# Patient Record
Sex: Male | Born: 1980 | Race: White | Hispanic: No | Marital: Single | Smoking: Never smoker
Health system: Southern US, Community
[De-identification: ages and names within clinical notes are randomized; demographics above are authoritative.]

## PROBLEM LIST (undated history)

## (undated) DIAGNOSIS — N200 Calculus of kidney: Secondary | ICD-10-CM

---

## 2001-11-29 ENCOUNTER — Ambulatory Visit (HOSPITAL_BASED_OUTPATIENT_CLINIC_OR_DEPARTMENT_OTHER): Admission: RE | Admit: 2001-11-29 | Discharge: 2001-11-29 | Payer: Self-pay | Admitting: *Deleted

## 2003-08-15 ENCOUNTER — Emergency Department (HOSPITAL_COMMUNITY): Admission: EM | Admit: 2003-08-15 | Discharge: 2003-08-16 | Payer: Self-pay | Admitting: Emergency Medicine

## 2008-07-29 ENCOUNTER — Emergency Department (HOSPITAL_COMMUNITY): Admission: EM | Admit: 2008-07-29 | Discharge: 2008-07-30 | Payer: Self-pay | Admitting: Emergency Medicine

## 2011-05-14 NOTE — Op Note (Signed)
[place]. [facility]  Patient:    ROSHON, DUELL Visit Number: [id number] MRN: [id number]          Service Type: DSU Location: [facility] Attending Physician:  Kendell Bane Dictated by:   Lowell Bouton, M.D. Proc. Date: 11/30/01 Admit Date:  11/29/2001 Discharge Date: 11/29/2001                             Operative Report  PREOPERATIVE DIAGNOSIS:  Lacerated ulnar digital nerve, left small finger.  POSTOPERATIVE DIAGNOSIS:  Lacerated ulnar digital nerve, left small finger with partial flexor digitorum superficialis tendon laceration.  PROCEDURE:  Repair of ulnar digital nerve and A2 pulley, left small finger.  SURGEON:  Lowell Bouton, M.D.  ANESTHESIA:  General anesthesia.  FINDINGS:  The patient had a complete laceration of the ulnar digital nerve at the level of the MP joint.  The flexor digitorum superficialis tendon had approximately a 40% laceration.  The profundus tendon was intact.  The A2 pulley was lacerated and was repaired to avoid hanging up from the tendon injury.  DESCRIPTION OF PROCEDURE:  Under general anesthesia with a tourniquet on the left arm, the left hand was prepped and draped in the usual fashion.  After exsanguinating the limb, the tourniquet was inflated to 225 mmHg.  The previous sutures were removed and the wound was bluntly spread open.  The retractor was inserted and the flexor tendon was identified.  The superficialis tendon had approximately a 40% laceration.  The profundus tendon was intact.  There was some triggering of the tendon.  A 6-0 Prolene suture was used to repair the pulley to avoid triggering.  The tendon did not require suturing.  The microscope was then brought in.  The ends of the digital nerve were identified.  They were trimmed back to good nerve tissue and dissected out and an end-to-end repair was performed with an interrupted 9-0 nylon suture using the microscope.   After an epineural repair was performed, the wound was irrigated copiously with saline and the skin was closed with 4-0 nylon sutures.  Sterile dressings were applied followed by dorsal protective splint.  The tourniquet was released with good circulation for the hand. The patient went to the recovery room awake, stable, and in good condition. Dictated by:   Lowell Bouton, M.D. Attending Physician:  Kendell Bane DD:  11/30/01 TD:  12/01/01 Job: [id number]

## 2011-09-24 LAB — HEPATIC FUNCTION PANEL
Albumin: 4.5
Bilirubin, Direct: <0.1
Total Bilirubin: 0.9

## 2011-09-24 LAB — PROTIME-INR: Prothrombin Time: 12.7

## 2011-09-24 LAB — CBC
MCHC: 33.8
MCV: 90.7
RBC: 5.24

## 2011-09-24 LAB — POCT I-STAT, CHEM 8
Calcium, Ion: 1.22
Glucose, Bld: 114 — ABNORMAL HIGH
HCT: 49
Hemoglobin: 16.7
Potassium: 3.5
TCO2: 28

## 2011-09-24 LAB — URINALYSIS, ROUTINE W REFLEX MICROSCOPIC
Protein, ur: 30 — AB
Urobilinogen, UA: 0.2

## 2011-09-24 LAB — DIFFERENTIAL
Basophils Relative: 0
Eosinophils Absolute: 0.1
Monocytes Relative: 4
Neutrophils Relative %: 84 — ABNORMAL HIGH

## 2011-09-24 LAB — URINE MICROSCOPIC-ADD ON

## 2011-09-24 LAB — LIPASE, BLOOD: Lipase: 30

## 2016-04-27 ENCOUNTER — Emergency Department (HOSPITAL_COMMUNITY): Payer: 59

## 2016-04-27 ENCOUNTER — Emergency Department (HOSPITAL_COMMUNITY)
Admission: EM | Admit: 2016-04-27 | Discharge: 2016-04-27 | Disposition: A | Discharge status: Home / Self Care | Payer: 59 | Attending: Emergency Medicine | Admitting: Emergency Medicine

## 2016-04-27 ENCOUNTER — Encounter (HOSPITAL_COMMUNITY): Payer: Self-pay

## 2016-04-27 DIAGNOSIS — R109 Unspecified abdominal pain: Secondary | ICD-10-CM | POA: Diagnosis present

## 2016-04-27 DIAGNOSIS — N201 Calculus of ureter: Secondary | ICD-10-CM

## 2016-04-27 HISTORY — DX: Calculus of kidney: N20.0

## 2016-04-27 LAB — URINALYSIS, ROUTINE W REFLEX MICROSCOPIC
Glucose, UA: NEGATIVE mg/dL
Ketones, ur: 15 mg/dL — AB
Nitrite: POSITIVE — AB
Protein, ur: 100 mg/dL — AB
Specific Gravity, Urine: 1.025 (ref 1.005–1.030)
pH: 7.5 (ref 5.0–8.0)

## 2016-04-27 LAB — I-STAT CHEM 8, ED
BUN: 15 mg/dL (ref 6–20)
Calcium, Ion: 1.15 mmol/L (ref 1.12–1.23)
Chloride: 106 mmol/L (ref 101–111)
Creatinine, Ser: 1.1 mg/dL (ref 0.61–1.24)
Glucose, Bld: 116 mg/dL — ABNORMAL HIGH (ref 65–99)
HCT: 48 % (ref 39.0–52.0)
Hemoglobin: 16.3 g/dL (ref 13.0–17.0)
Potassium: 3.9 mmol/L (ref 3.5–5.1)
Sodium: 143 mmol/L (ref 135–145)
TCO2: 22 mmol/L (ref 0–100)

## 2016-04-27 LAB — URINE MICROSCOPIC-ADD ON

## 2016-04-27 MED ORDER — ONDANSETRON HCL 4 MG/2ML IJ SOLN
4.0000 mg | Freq: Once | INTRAMUSCULAR | Status: AC
Start: 2016-04-27 — End: 2016-04-27
  Administered 2016-04-27: 4 mg via INTRAVENOUS
  Filled 2016-04-27: qty 2

## 2016-04-27 MED ORDER — KETOROLAC TROMETHAMINE 30 MG/ML IJ SOLN
30.0000 mg | Freq: Once | INTRAMUSCULAR | Status: AC
  Administered 2016-04-27: 30 mg via INTRAVENOUS
  Filled 2016-04-27: qty 1

## 2016-04-27 MED ORDER — HYDROMORPHONE HCL 1 MG/ML IJ SOLN: 1.0000 mg | Freq: Once | INTRAMUSCULAR | Status: DC

## 2016-04-27 MED ORDER — OXYCODONE-ACETAMINOPHEN 5-325 MG PO TABS: 1.0000 | ORAL_TABLET | Freq: Four times a day (QID) | ORAL | Status: AC | PRN

## 2016-04-27 MED ORDER — HYDROMORPHONE HCL 1 MG/ML IJ SOLN
1.0000 mg | Freq: Once | INTRAMUSCULAR | Status: AC
  Administered 2016-04-27: 1 mg via INTRAVENOUS
  Filled 2016-04-27: qty 1

## 2016-04-27 MED ORDER — TAMSULOSIN HCL 0.4 MG PO CAPS: 0.4000 mg | ORAL_CAPSULE | Freq: Every day | ORAL | Status: AC

## 2016-04-27 NOTE — Discharge Instructions (Signed)
Return here as needed for any worsening in your condition.  Follow-up with the urologist provided.  Increase your fluid intake

## 2016-04-27 NOTE — ED Provider Notes (Signed)
CSN: [id number]     Arrival date & time 04/27/16  0846 History   First MD Initiated Contact with Patient 04/27/16 [phone]     Chief Complaint  Patient presents with  . Flank Pain     (Consider location/radiation/quality/duration/timing/severity/associated sxs/prior Treatment) HPI Patient presents to the emergency department with a sudden onset of left flank pain that started this morning.  The patient states that he started with sudden pain with nausea but no vomiting.  Patient states that nothing seems make the condition better or worse.  He states he did not take any medications prior to arrival.The patient denies chest pain, shortness of breath, headache,blurred vision, neck pain, fever, cough, weakness, numbness, dizziness, anorexia, edema, vomiting, diarrhea, rash, back pain, dysuria, hematemesis, bloody stool, near syncope, or syncope. Past Medical History  Diagnosis Date  . Kidney stone    History reviewed. No pertinent past surgical history. No family history on file. Social History  Substance Use Topics  . Smoking status: Never Smoker   . Smokeless tobacco: None  . Alcohol Use: None    Review of Systems All other systems negative except as documented in the HPI. All pertinent positives and negatives as reviewed in the HPI.   Allergies  Review of patient's allergies indicates no known allergies.  Home Medications   Prior to Admission medications   Medication Sig Start Date End Date Taking? Authorizing Provider  aspirin-acetaminophen-caffeine (EXCEDRIN MIGRAINE) [id number] MG tablet Take 2 tablets by mouth every 6 (six) hours as needed for headache.   Yes Historical Provider, MD   BP 131/83 mmHg  Pulse 57  Temp(Src) 98 F (36.7 C) (Oral)  Resp 20  SpO2 96% Physical Exam  Constitutional: He is oriented to person, place, and time. He appears well-developed and well-nourished. No distress.  HENT:  Head: Normocephalic and atraumatic.  Mouth/Throat: Oropharynx is clear  and moist.  Eyes: Pupils are equal, round, and reactive to light.  Neck: Normal range of motion. Neck supple.  Cardiovascular: Normal rate, regular rhythm and normal heart sounds.  Exam reveals no gallop and no friction rub.   No murmur heard. Pulmonary/Chest: Effort normal and breath sounds normal. No respiratory distress. He has no wheezes.  Abdominal: Soft. Bowel sounds are normal. He exhibits no distension. There is no tenderness.  Neurological: He is alert and oriented to person, place, and time. He exhibits normal muscle tone. Coordination normal.  Skin: Skin is warm and dry. No rash noted. No erythema.  Psychiatric: He has a normal mood and affect. His behavior is normal.  Nursing note and vitals reviewed.   ED Course  Procedures (including critical care time) Labs Review Labs Reviewed  URINALYSIS, ROUTINE W REFLEX MICROSCOPIC (NOT AT [facility]) - Abnormal; Notable for the following:    Color, Urine RED (*)    APPearance TURBID (*)    Hgb urine dipstick LARGE (*)    Bilirubin Urine MODERATE (*)    Ketones, ur 15 (*)    Protein, ur 100 (*)    Nitrite POSITIVE (*)    Leukocytes, UA MODERATE (*)    All other components within normal limits  URINE MICROSCOPIC-ADD ON - Abnormal; Notable for the following:    Squamous Epithelial / LPF 0-5 (*)    Bacteria, UA MANY (*)    Crystals CA OXALATE CRYSTALS (*)    All other components within normal limits  I-STAT CHEM 8, ED - Abnormal; Notable for the following:    Glucose, Bld 116 (*)  All other components within normal limits    Imaging Review Ct Renal Stone Study  04/27/2016  CLINICAL DATA:  Left flank pain, nausea EXAM: CT ABDOMEN AND PELVIS WITHOUT CONTRAST TECHNIQUE: Multidetector CT imaging of the abdomen and pelvis was performed following the standard protocol without IV contrast. COMPARISON:  07/29/2008 FINDINGS: Lower chest:  Lung bases are unremarkable. Hepatobiliary: Unenhanced liver is unremarkable. No calcified gallstones are  noted within gallbladder. No intrahepatic biliary ductal dilatation. Pancreas: Unenhanced pancreas is unremarkable. Spleen: Unenhanced spleen is unremarkable. Adrenals/Urinary Tract: No adrenal gland mass. There is mild left hydronephrosis and minimal left proximal hydroureter. Nonobstructive calcified calculus in lower pole of the right kidney measures 5.7 mm. At least stool nonobstructive calculi are noted in lower pole of the left kidney the largest measures 3 mm. Axial image 46 there is 4 mm partially obstructive calculus in proximal left ureter at the level of upper endplate of L4 vertebral body. Bilateral distal ureter is unremarkable. No calcified calculi are noted within under distended urinary bladder. Stomach/Bowel: No small bowel obstruction. No gastric outlet obstruction. No thickened or dilated small bowel loops. The terminal ileum is unremarkable. Normal appendix is noted in axial image 55. No pericecal inflammation. No distal colonic obstruction.  No colitis or diverticulitis. Vascular/Lymphatic: There is no aortic aneurysm. No retroperitoneal or mesenteric adenopathy. Reproductive: The prostate gland is normal size. A central calcification within prostate gland measures 6 mm. Seminal vesicles are unremarkable. Other: No ascites or free air. Tiny umbilical hernia containing fat without evidence of acute complication. Tiny left inguinal canal hernia containing fat without evidence of acute complication. Musculoskeletal: No destructive bony lesions are noted. Sagittal images of the spine are unremarkable. No destructive bony lesions are noted within pelvis. IMPRESSION: 1. There is bilateral nonobstructive nephrolithiasis. 2. Mild left hydronephrosis and minimal proximal left hydroureter. There is 4 mm partially obstructive calculus in proximal left ureter at the level of upper endplate of L4 vertebral body. 3. Bilateral distal ureter is unremarkable. 4. Normal appendix.  No pericecal inflammation. 5. No  calcified calculi are noted within urinary bladder. 6. No small bowel obstruction. Electronically Signed   By: Natasha Mead M.D.   On: 04/27/2016 10:06   I have personally reviewed and evaluated these images and lab results as part of my medical decision-making.  Patient will be treated for a ureteral stone and advised him that he will need follow-up with urology.  The patient's pain is considerably better at this point he is feeling relief of his symptoms.  I advised the patient to return here as needed.  Patient agrees the plan and all questions were answered.  Charlestine Night, PA-C 04/27/16 1215  Benjiman Core, MD 04/27/16 1536

## 2016-04-27 NOTE — ED Notes (Signed)
Patient here with left flank pain since am. Reports that the pain was sudden with nausea and vomiting. Thinks its a stone. Pale on arrival

## 2016-04-27 NOTE — ED Notes (Signed)
Patient transported to CT 

## 2017-01-23 IMAGING — CT CT RENAL STONE PROTOCOL
2 of 4 series · 15 of 46 positions shown, 17 images · non-contrast
Comparison: 07/29/2008

CLINICAL DATA: Left flank pain, nausea

EXAM:
CT ABDOMEN AND PELVIS WITHOUT CONTRAST
TECHNIQUE: Multidetector CT imaging of the abdomen and pelvis was performed
following the standard protocol without IV contrast.

[Series 2: renal stone 5mm · axial · 0.79mm/px · z∈[-1115,-690]mm · 12 of 93 slices shown, 14 images]
[im 4/93  soft-tissue]
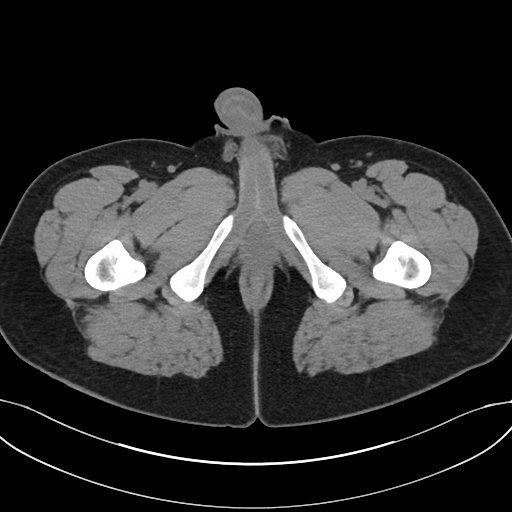
[im 4/93  bone]
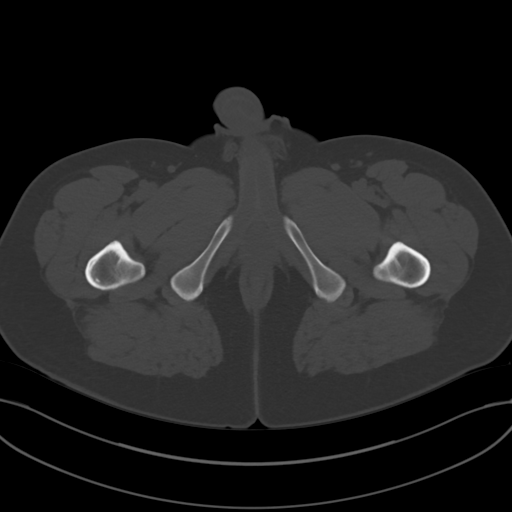
[im 12/93  soft-tissue]
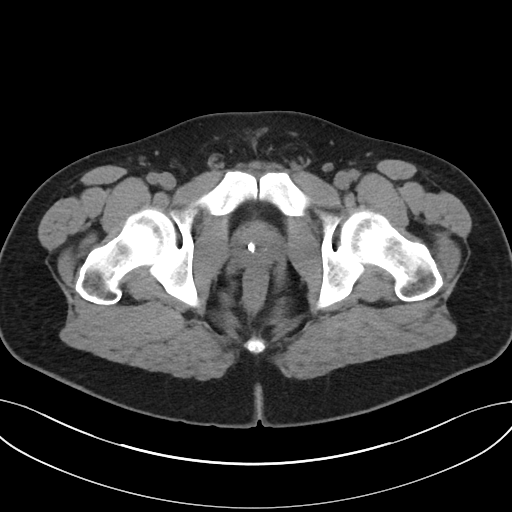
[im 20/93  soft-tissue]
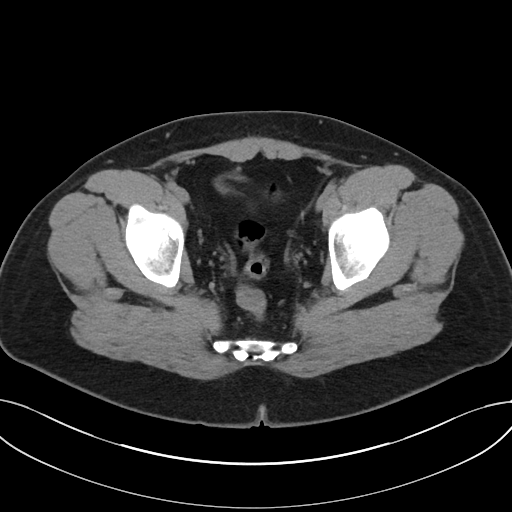
[im 27/93  soft-tissue]
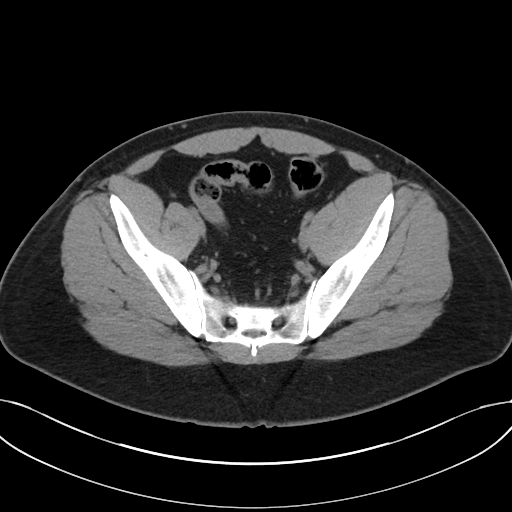
[im 35/93  soft-tissue]
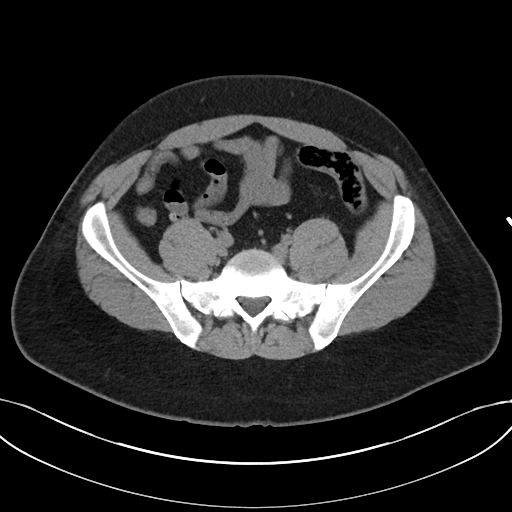
[im 43/93  soft-tissue]
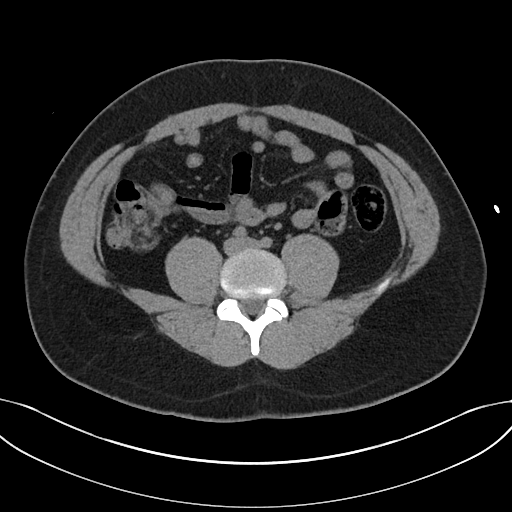
[im 50/93  soft-tissue]
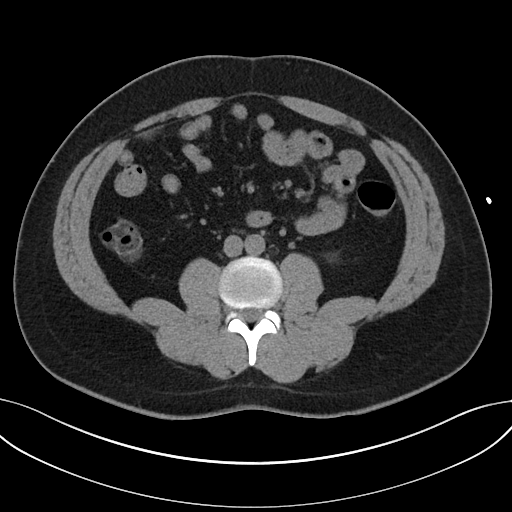
[im 58/93  soft-tissue]
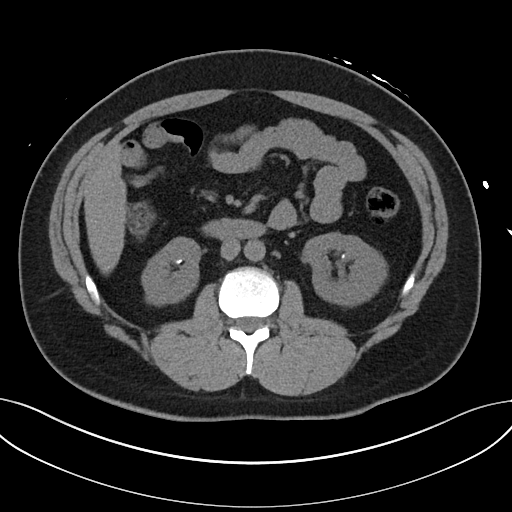
[im 66/93  soft-tissue]
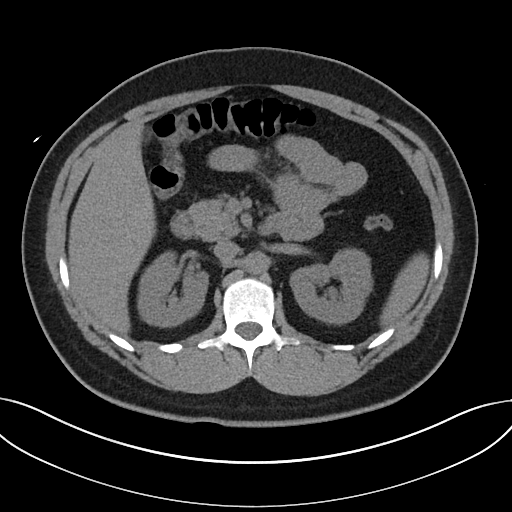
[im 66/93  bone]
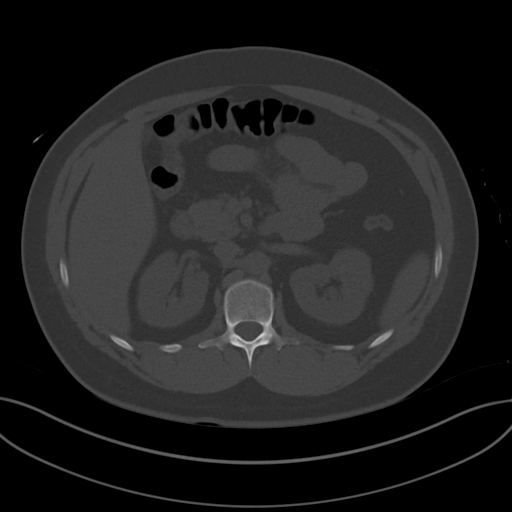
[im 73/93  soft-tissue]
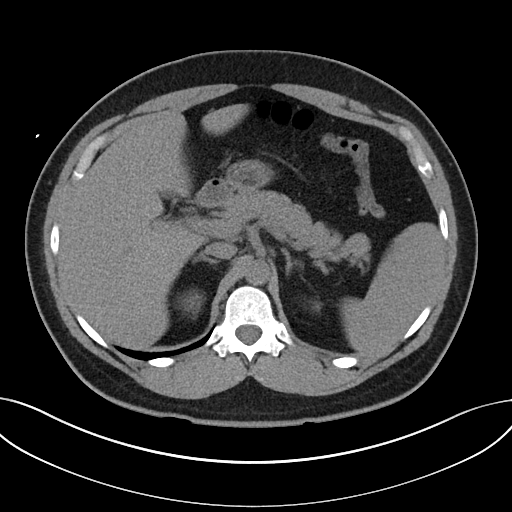
[im 81/93  soft-tissue]
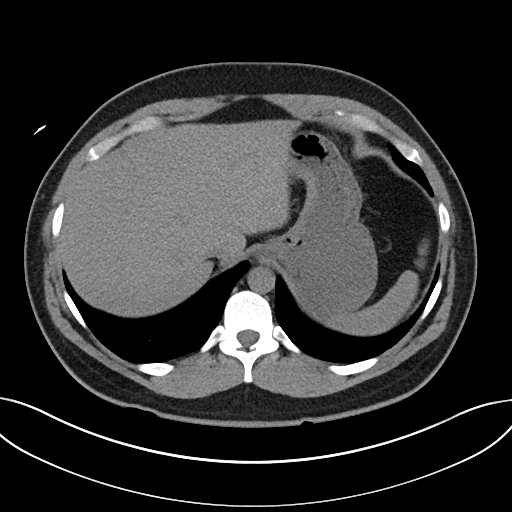
[im 89/93  soft-tissue]
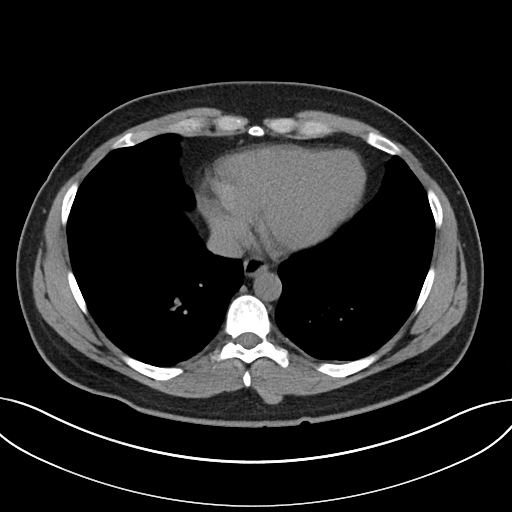

[Series 4: renal stone 3.0 cor · coronal · 0.76mm/px · 3 of 82 slices shown]
[im 28/82  soft-tissue]
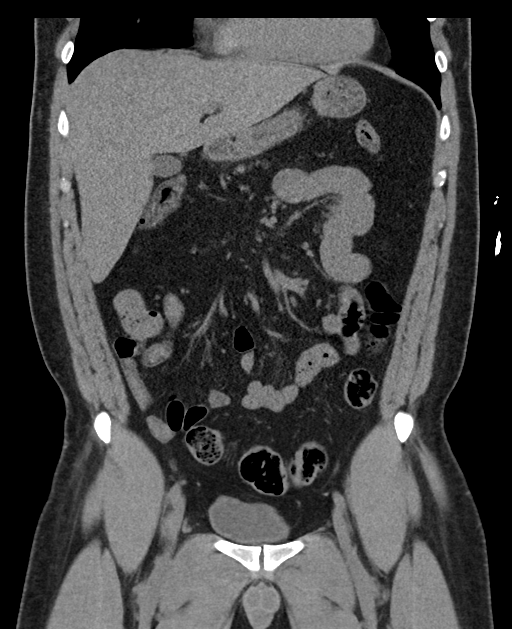
[im 37/82  soft-tissue]
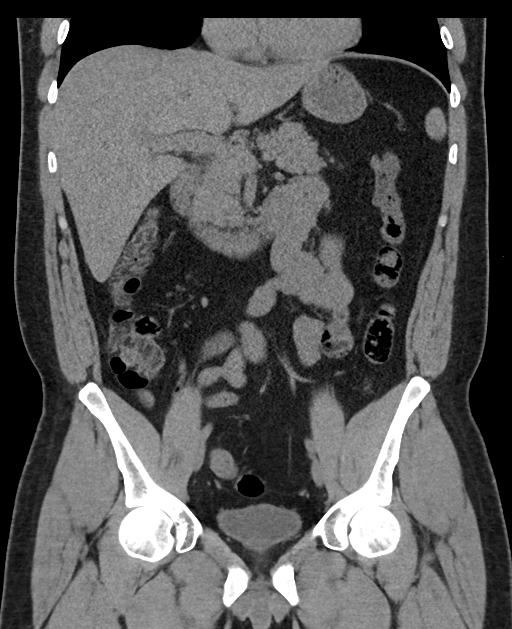
[im 46/82  soft-tissue]
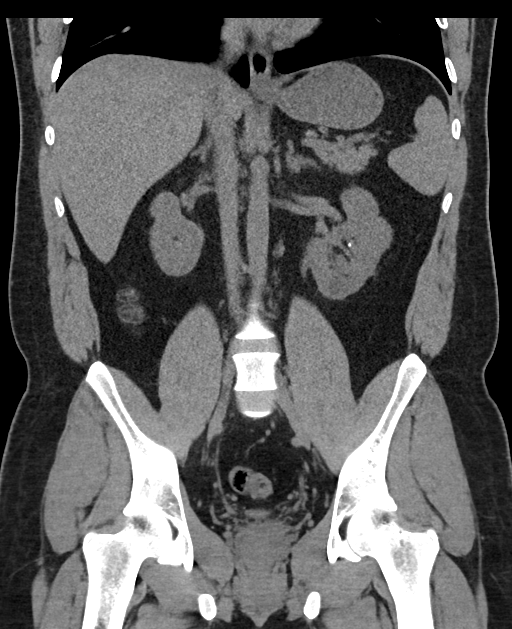

[15 of 46 positions shown; findings below may reference images not displayed]

FINDINGS: Lower chest:  Lung bases are unremarkable.

Hepatobiliary: Unenhanced liver is unremarkable. No calcified
gallstones are noted within gallbladder. No intrahepatic biliary
ductal dilatation.

Pancreas: Unenhanced pancreas is unremarkable.

Spleen: Unenhanced spleen is unremarkable.

Adrenals/Urinary Tract: No adrenal gland mass. There is mild left
hydronephrosis and minimal left proximal hydroureter. Nonobstructive
calcified calculus in lower pole of the right kidney measures
mm. At least stool nonobstructive calculi are noted in lower pole of
the left kidney the largest measures 3 mm.

Axial image 46 there is 4 mm partially obstructive calculus in
proximal left ureter at the level of upper endplate of L4 vertebral
body. Bilateral distal ureter is unremarkable. No calcified calculi
are noted within under distended urinary bladder.

Stomach/Bowel: No small bowel obstruction. No gastric outlet
obstruction. No thickened or dilated small bowel loops. The terminal
ileum is unremarkable. Normal appendix is noted in axial image 55.
No pericecal inflammation.

No distal colonic obstruction.  No colitis or diverticulitis.

Vascular/Lymphatic: There is no aortic aneurysm. No retroperitoneal
or mesenteric adenopathy.

Reproductive: The prostate gland is normal size. A central
calcification within prostate gland measures 6 mm. Seminal vesicles
are unremarkable.

Other: No ascites or free air. Tiny umbilical hernia containing fat
without evidence of acute complication. Tiny left inguinal canal
hernia containing fat without evidence of acute complication.

Musculoskeletal: No destructive bony lesions are noted. Sagittal
images of the spine are unremarkable. No destructive bony lesions
are noted within pelvis.
IMPRESSION: 1. There is bilateral nonobstructive nephrolithiasis.
2. Mild left hydronephrosis and minimal proximal left hydroureter.
There is 4 mm partially obstructive calculus in proximal left ureter
at the level of upper endplate of L4 vertebral body.
3. Bilateral distal ureter is unremarkable.
4. Normal appendix.  No pericecal inflammation.
5. No calcified calculi are noted within urinary bladder.
6. No small bowel obstruction.
